# Patient Record
Sex: Male | Born: 1990 | Race: White | Hispanic: No | Marital: Single | Smoking: Former smoker
Health system: Southern US, Community
[De-identification: ages and names within clinical notes are randomized; demographics above are authoritative.]

## PROBLEM LIST (undated history)

## (undated) DIAGNOSIS — K219 Gastro-esophageal reflux disease without esophagitis: Secondary | ICD-10-CM

## (undated) DIAGNOSIS — F32A Depression, unspecified: Secondary | ICD-10-CM

## (undated) DIAGNOSIS — F909 Attention-deficit hyperactivity disorder, unspecified type: Secondary | ICD-10-CM

## (undated) DIAGNOSIS — F329 Major depressive disorder, single episode, unspecified: Secondary | ICD-10-CM

## (undated) DIAGNOSIS — F1090 Alcohol use, unspecified, uncomplicated: Secondary | ICD-10-CM

## (undated) DIAGNOSIS — F419 Anxiety disorder, unspecified: Secondary | ICD-10-CM

## (undated) DIAGNOSIS — F319 Bipolar disorder, unspecified: Secondary | ICD-10-CM

## (undated) HISTORY — PX: HAND SURGERY: SHX662

---

## 2012-12-03 ENCOUNTER — Inpatient Hospital Stay: Payer: Self-pay | Admitting: Psychiatry

## 2012-12-03 LAB — URINALYSIS, COMPLETE
Bilirubin,UR: NEGATIVE
Blood: NEGATIVE
Glucose,UR: NEGATIVE mg/dL (ref 0–75)
Ketone: NEGATIVE
Leukocyte Esterase: NEGATIVE
Squamous Epithelial: <1
WBC UR: 5 /HPF (ref 0–5)

## 2012-12-03 LAB — CBC
HCT: 44 % (ref 40.0–52.0)
HGB: 15.5 g/dL (ref 13.0–18.0)
MCHC: 35.3 g/dL (ref 32.0–36.0)
MCV: 88 fL (ref 80–100)
Platelet: 209 10*3/uL (ref 150–440)
RDW: 12.6 % (ref 11.5–14.5)
WBC: 7 10*3/uL (ref 3.8–10.6)

## 2012-12-03 LAB — COMPREHENSIVE METABOLIC PANEL
Anion Gap: 4 — ABNORMAL LOW (ref 7–16)
BUN: 11 mg/dL (ref 7–18)
Bilirubin,Total: 0.5 mg/dL (ref 0.2–1.0)
Calcium, Total: 9.1 mg/dL (ref 8.5–10.1)
Co2: 30 mmol/L (ref 21–32)
Creatinine: 1.04 mg/dL (ref 0.60–1.30)
EGFR (Non-African Amer.): >60
Glucose: 72 mg/dL (ref 65–99)
Osmolality: 275 (ref 275–301)
Potassium: 3.7 mmol/L (ref 3.5–5.1)
SGPT (ALT): 18 U/L (ref 12–78)
Sodium: 139 mmol/L (ref 136–145)
Total Protein: 7.4 g/dL (ref 6.4–8.2)

## 2012-12-03 LAB — DRUG SCREEN, URINE
Amphetamines, Ur Screen: NEGATIVE (ref ?–1000)
Barbiturates, Ur Screen: NEGATIVE (ref ?–200)
Benzodiazepine, Ur Scrn: NEGATIVE (ref ?–200)
Cocaine Metabolite,Ur ~~LOC~~: NEGATIVE (ref ?–300)
Methadone, Ur Screen: NEGATIVE (ref ?–300)
Phencyclidine (PCP) Ur S: NEGATIVE (ref ?–25)

## 2012-12-03 LAB — ETHANOL
Ethanol %: <0.003 % (ref 0.000–0.080)
Ethanol: <3 mg/dL

## 2013-08-12 ENCOUNTER — Emergency Department (HOSPITAL_COMMUNITY)
Admission: EM | Admit: 2013-08-12 | Discharge: 2013-08-12 | Disposition: A | Discharge status: Home / Self Care | Payer: BC Managed Care – PPO | Attending: Emergency Medicine | Admitting: Emergency Medicine

## 2013-08-12 ENCOUNTER — Emergency Department (HOSPITAL_COMMUNITY): Payer: BC Managed Care – PPO

## 2013-08-12 ENCOUNTER — Encounter (HOSPITAL_COMMUNITY): Payer: Self-pay | Admitting: Emergency Medicine

## 2013-08-12 DIAGNOSIS — S335XXA Sprain of ligaments of lumbar spine, initial encounter: Secondary | ICD-10-CM | POA: Insufficient documentation

## 2013-08-12 DIAGNOSIS — Z87891 Personal history of nicotine dependence: Secondary | ICD-10-CM | POA: Insufficient documentation

## 2013-08-12 DIAGNOSIS — S60219A Contusion of unspecified wrist, initial encounter: Secondary | ICD-10-CM | POA: Insufficient documentation

## 2013-08-12 DIAGNOSIS — S5000XA Contusion of unspecified elbow, initial encounter: Secondary | ICD-10-CM | POA: Insufficient documentation

## 2013-08-12 DIAGNOSIS — F909 Attention-deficit hyperactivity disorder, unspecified type: Secondary | ICD-10-CM | POA: Insufficient documentation

## 2013-08-12 DIAGNOSIS — S90122A Contusion of left lesser toe(s) without damage to nail, initial encounter: Secondary | ICD-10-CM

## 2013-08-12 DIAGNOSIS — S0990XA Unspecified injury of head, initial encounter: Secondary | ICD-10-CM | POA: Insufficient documentation

## 2013-08-12 DIAGNOSIS — S5002XA Contusion of left elbow, initial encounter: Secondary | ICD-10-CM

## 2013-08-12 DIAGNOSIS — S60211A Contusion of right wrist, initial encounter: Secondary | ICD-10-CM

## 2013-08-12 DIAGNOSIS — S9030XA Contusion of unspecified foot, initial encounter: Secondary | ICD-10-CM | POA: Insufficient documentation

## 2013-08-12 DIAGNOSIS — S90129A Contusion of unspecified lesser toe(s) without damage to nail, initial encounter: Secondary | ICD-10-CM | POA: Insufficient documentation

## 2013-08-12 DIAGNOSIS — S20212A Contusion of left front wall of thorax, initial encounter: Secondary | ICD-10-CM

## 2013-08-12 DIAGNOSIS — F319 Bipolar disorder, unspecified: Secondary | ICD-10-CM | POA: Insufficient documentation

## 2013-08-12 DIAGNOSIS — S39012A Strain of muscle, fascia and tendon of lower back, initial encounter: Secondary | ICD-10-CM

## 2013-08-12 DIAGNOSIS — Y92410 Unspecified street and highway as the place of occurrence of the external cause: Secondary | ICD-10-CM | POA: Insufficient documentation

## 2013-08-12 DIAGNOSIS — S20219A Contusion of unspecified front wall of thorax, initial encounter: Secondary | ICD-10-CM | POA: Insufficient documentation

## 2013-08-12 DIAGNOSIS — Z23 Encounter for immunization: Secondary | ICD-10-CM | POA: Insufficient documentation

## 2013-08-12 DIAGNOSIS — S9032XA Contusion of left foot, initial encounter: Secondary | ICD-10-CM

## 2013-08-12 DIAGNOSIS — Y9389 Activity, other specified: Secondary | ICD-10-CM | POA: Insufficient documentation

## 2013-08-12 HISTORY — DX: Depression, unspecified: F32.A

## 2013-08-12 HISTORY — DX: Attention-deficit hyperactivity disorder, unspecified type: F90.9

## 2013-08-12 HISTORY — DX: Major depressive disorder, single episode, unspecified: F32.9

## 2013-08-12 HISTORY — DX: Anxiety disorder, unspecified: F41.9

## 2013-08-12 HISTORY — DX: Bipolar disorder, unspecified: F31.9

## 2013-08-12 MED ORDER — NAPROXEN 500 MG PO TABS: 500.0000 mg | ORAL_TABLET | Freq: Two times a day (BID) | ORAL | Status: DC

## 2013-08-12 MED ORDER — TETANUS-DIPHTH-ACELL PERTUSSIS 5-2.5-18.5 LF-MCG/0.5 IM SUSP
0.5000 mL | Freq: Once | INTRAMUSCULAR | Status: AC
  Administered 2013-08-12: 0.5 mL via INTRAMUSCULAR
  Filled 2013-08-12: qty 0.5

## 2013-08-12 MED ORDER — OXYCODONE-ACETAMINOPHEN 5-325 MG PO TABS: 1.0000 | ORAL_TABLET | ORAL | Status: DC | PRN

## 2013-08-12 NOTE — ED Notes (Signed)
All abrasions cleaned w/ Shur Clens. All bleeding controlled.
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
## 2013-08-12 NOTE — ED Notes (Signed)
Patient and patients mother state understanding of discharge instructions and prescription medications. Pt verbalizes understanding of home wound care. Patient ambulatory out of the department at this time with mother.

## 2013-08-12 NOTE — ED Notes (Signed)
Pt placed in c-collar
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
## 2022-02-14 DIAGNOSIS — G629 Polyneuropathy, unspecified: Secondary | ICD-10-CM

## 2022-02-14 HISTORY — DX: Polyneuropathy, unspecified: G62.9

## 2022-05-25 ENCOUNTER — Ambulatory Visit: Payer: Self-pay | Admitting: Orthopedic Surgery

## 2022-05-25 DIAGNOSIS — M5126 Other intervertebral disc displacement, lumbar region: Secondary | ICD-10-CM

## 2022-05-31 NOTE — Pre-Procedure Instructions (Signed)
Surgical Instructions    Your procedure is scheduled on June 09, 2022.  Report to [facility] Main Entrance "A" at 5:30 A.M., then check in with the Admitting office.  Call this number if you have problems the morning of surgery:  [phone]  If you have any questions prior to your surgery date call [phone]: Open Monday-Friday 8am-4pm If you experience any cold or flu symptoms such as cough, fever, chills, shortness of breath, etc. between now and your scheduled surgery, please notify us at the above number.     Remember:  Do not eat after midnight the night before your surgery  You may drink clear liquids until 4:30 AM the morning of your surgery.   Clear liquids allowed are: Water, Non-Citrus Juices (without pulp), Carbonated Beverages, Clear Tea, Black Coffee Only (NO MILK, CREAM OR POWDERED CREAMER of any kind), and Gatorade.  Patient Instructions  The night before surgery:  No food after midnight. ONLY clear liquids after midnight  The day of surgery (if you do NOT have diabetes):  Drink ONE (1) Pre-Surgery Clear Ensure by 4:30 AM the morning of surgery. Drink in one sitting. Do not sip.  This drink was given to you during your hospital  pre-op appointment visit.  Nothing else to drink after completing the  Pre-Surgery Clear Ensure.         If you have questions, please contact your surgeon's office.     Take these medicines the morning of surgery with A SIP OF WATER:  gabapentin (NEURONTIN)    As of today, STOP taking any Aspirin (unless otherwise instructed by your surgeon) Aleve, Naproxen, Ibuprofen, Motrin, Advil, Goody's, BC's, all herbal medications, fish oil, and all vitamins.                     Do NOT Smoke (Tobacco/Vaping) for 24 hours prior to your procedure.  If you use a CPAP at night, you may bring your mask/headgear for your overnight stay.   Contacts, glasses, piercing's, hearing aid's, dentures or partials may not be worn into surgery, please  bring cases for these belongings.    For patients admitted to the hospital, discharge time will be determined by your treatment team.   Patients discharged the day of surgery will not be allowed to drive home, and someone needs to stay with them for 24 hours.  SURGICAL WAITING ROOM VISITATION Patients having a surgery/procedure may have no more than 2 support people in the waiting area - these visitors may rotate.   Children under the age of 50 must have an adult with them who is not the patient. Pre-op nurse will coordinate an appropriate time for 1 support person to accompany patient in pre-op.  This support person may not rotate.   Please refer to the [facility] website for the visitor guidelines for Inpatients (after your surgery is over and you are in a regular room).   Please follow the instructions on the handout you received at your pre-admission appointment about preparing for your upcoming surgery using the CHG surgical soap. If you have any questions or concerns, please call one of the numbers listed on the first page of this paperwork.   If you received a COVID test during your pre-op visit  it is requested that you wear a mask when out in public, stay away from anyone that may not be feeling well and notify your surgeon if you develop symptoms. If you have been in contact with anyone that  has tested positive in the last 10 days please notify you surgeon.

## 2022-06-01 ENCOUNTER — Encounter (HOSPITAL_COMMUNITY)
Admission: RE | Admit: 2022-06-01 | Discharge: 2022-06-01 | Disposition: A | Discharge status: Home / Self Care | Payer: 59 | Source: Ambulatory Visit | Attending: Specialist | Admitting: Specialist

## 2022-06-01 ENCOUNTER — Other Ambulatory Visit: Payer: Self-pay

## 2022-06-01 ENCOUNTER — Ambulatory Visit (HOSPITAL_COMMUNITY)
Admission: RE | Admit: 2022-06-01 | Discharge: 2022-06-01 | Disposition: A | Discharge status: Home / Self Care | Payer: Self-pay | Source: Ambulatory Visit | Attending: Orthopedic Surgery | Admitting: Orthopedic Surgery

## 2022-06-01 ENCOUNTER — Encounter (HOSPITAL_COMMUNITY): Payer: Self-pay

## 2022-06-01 VITALS — BP 143/71 | HR 88 | Temp 98.6°F | Resp 20 | Ht 76.0 in | Wt 183.3 lb

## 2022-06-01 DIAGNOSIS — Z01818 Encounter for other preprocedural examination: Secondary | ICD-10-CM | POA: Insufficient documentation

## 2022-06-01 DIAGNOSIS — Z789 Other specified health status: Secondary | ICD-10-CM | POA: Diagnosis not present

## 2022-06-01 DIAGNOSIS — M5126 Other intervertebral disc displacement, lumbar region: Secondary | ICD-10-CM

## 2022-06-01 HISTORY — DX: Gastro-esophageal reflux disease without esophagitis: K21.9

## 2022-06-01 HISTORY — DX: Alcohol use, unspecified, uncomplicated: F10.90

## 2022-06-01 LAB — CBC
HCT: 44.7 % (ref 39.0–52.0)
Hemoglobin: 15.7 g/dL (ref 13.0–17.0)
MCH: 31 pg (ref 26.0–34.0)
MCHC: 35.1 g/dL (ref 30.0–36.0)
MCV: 88.2 fL (ref 80.0–100.0)
Platelets: 225 10*3/uL (ref 150–400)
RBC: 5.07 MIL/uL (ref 4.22–5.81)
RDW: 12.3 % (ref 11.5–15.5)
WBC: 6.5 10*3/uL (ref 4.0–10.5)
nRBC: 0 % (ref 0.0–0.2)

## 2022-06-01 LAB — SURGICAL PCR SCREEN
MRSA, PCR: NEGATIVE
Staphylococcus aureus: POSITIVE — AB

## 2022-06-01 LAB — COMPREHENSIVE METABOLIC PANEL
ALT: 114 U/L — ABNORMAL HIGH (ref 0–44)
AST: 56 U/L — ABNORMAL HIGH (ref 15–41)
Albumin: 3.9 g/dL (ref 3.5–5.0)
Alkaline Phosphatase: 79 U/L (ref 38–126)
Anion gap: 11 (ref 5–15)
BUN: 13 mg/dL (ref 6–20)
CO2: 26 mmol/L (ref 22–32)
Calcium: 9.9 mg/dL (ref 8.9–10.3)
Chloride: 100 mmol/L (ref 98–111)
Creatinine, Ser: 1.08 mg/dL (ref 0.61–1.24)
GFR, Estimated: >60 mL/min (ref >60)
Glucose, Bld: 87 mg/dL (ref 70–99)
Potassium: 4.3 mmol/L (ref 3.5–5.1)
Sodium: 137 mmol/L (ref 135–145)
Total Bilirubin: 0.6 mg/dL (ref 0.3–1.2)
Total Protein: 7.1 g/dL (ref 6.5–8.1)

## 2022-06-01 NOTE — Progress Notes (Addendum)
PCP - Pt does not have one Cardiologist - Denies  PPM/ICD - Denies Device Orders - n/a Rep Notified - n/a   Chest x-ray - n/a EKG - Denies Stress Test - Denies ECHO - Denies Cardiac Cath - Denies  Sleep Study - Pt has had a sleep study before but result was negative. CPAP - n/a  No MD  Last dose of GLP1 agonist- n/a GLP1 instructions: n/a  Blood Thinner Instructions: n/a Aspirin Instructions: n/a  ERAS Protcol - Clear liquids until 0430 morning of surgery PRE-SURGERY Ensure or G2- Ensure given to pt with instructions  COVID TEST- n/a   Anesthesia review: Yes. Abnormal ALT result on BMP.   Patient denies shortness of breath, fever, cough and chest pain at PAT appointment. Pt had a cold with fever at the end of February with resolution of symptoms within one week. He has been asymptomatic for greater than two weeks. Discussed with Antionette Poles, PA-C   All instructions explained to the patient, with a verbal understanding of the material. Patient agrees to go over the instructions while at home for a better understanding. Patient also instructed to self quarantine after being tested for COVID-19. The opportunity to ask questions was provided.

## 2022-06-02 ENCOUNTER — Ambulatory Visit: Payer: Self-pay | Admitting: Orthopedic Surgery

## 2022-06-02 NOTE — H&P (View-Only) (Signed)
Alonza Knisley is an 32 y.o. male.   Chief Complaint: back and right leg pain HPI: Reason for Visit: (normal) visit for: (back) Context: 3 months - onset Location (Lower Extremity): lower back pain ; leg pain on the right, , Severity: pain level 5/10 Timing: constant Aggravating Factors: lying down; standing upright; sitting; Associated Symptoms: numbness/tingling Medications: The patient is taking gabapentin Notes: right L5 SNRB ordered 05/13/22 - still pending as of 05/17/22 Patient reports no improvement in numbness. He feels like his leg is getting weaker. Therapy has made him worse  Past Medical History:  Diagnosis Date   ADHD (attention deficit hyperactivity disorder)    Anxiety    Bipolar 1 disorder    Depression    GERD (gastroesophageal reflux disease)    Neuropathy 2024   Right Leg    Past Surgical History:  Procedure Laterality Date   HAND SURGERY Left    Clean out MRSA infection    No family history on file. Social History:  reports that he quit smoking about 4 years ago. His smoking use included cigarettes. His smokeless tobacco use includes chew. He reports that he does not currently use alcohol after a past usage of about 12.0 standard drinks of alcohol per week. He reports that he does not use drugs.  Allergies: No Known Allergies  Current meds: gabapentin   Results for orders placed or performed during the hospital encounter of 06/01/22 (from the past 48 hour(s))  Surgical pcr screen     Status: Abnormal   Collection Time: 06/01/22  9:14 AM   Specimen: Nasal Mucosa; Nasal Swab  Result Value Ref Range   MRSA, PCR NEGATIVE NEGATIVE   Staphylococcus aureus POSITIVE (A) NEGATIVE    Comment: (NOTE) The Xpert SA Assay (FDA approved for NASAL specimens in patients 63 years of age and older), is one component of a comprehensive surveillance program. It is not intended to diagnose infection nor to guide or monitor treatment. Performed at [facility]  [facility], [address] [place], [place] [address]   Comprehensive metabolic panel per protocol     Status: Abnormal   Collection Time: 06/01/22  9:30 AM  Result Value Ref Range   Sodium 137 135 - 145 mmol/L   Potassium 4.3 3.5 - 5.1 mmol/L   Chloride 100 98 - 111 mmol/L   CO2 26 22 - 32 mmol/L   Glucose, Bld 87 70 - 99 mg/dL    Comment: Glucose reference range applies only to samples taken after fasting for at least 8 hours.   BUN 13 6 - 20 mg/dL   Creatinine, Ser 2.95 0.61 - 1.24 mg/dL   Calcium 9.9 8.9 - 62.1 mg/dL   Total Protein 7.1 6.5 - 8.1 g/dL   Albumin 3.9 3.5 - 5.0 g/dL   AST 56 (H) 15 - 41 U/L   ALT 114 (H) 0 - 44 U/L   Alkaline Phosphatase 79 38 - 126 U/L   Total Bilirubin 0.6 0.3 - 1.2 mg/dL   GFR, Estimated >30 >86 mL/min    Comment: (NOTE) Calculated using the CKD-EPI Creatinine Equation (2021)    Anion gap 11 5 - 15    Comment: Performed at [facility], [address], [place], [place] [address]  CBC per protocol     Status: None   Collection Time: 06/01/22  9:30 AM  Result Value Ref Range   WBC 6.5 4.0 - 10.5 K/uL   RBC 5.07 4.22 - 5.81 MIL/uL  Hemoglobin 15.7 13.0 - 17.0 g/dL   HCT 16.1 09.6 - 04.5 %   MCV 88.2 80.0 - 100.0 fL   MCH 31.0 26.0 - 34.0 pg   MCHC 35.1 30.0 - 36.0 g/dL   RDW 40.9 81.1 - 91.4 %   Platelets 225 150 - 400 K/uL   nRBC 0.0 0.0 - 0.2 %    Comment: Performed at [facility], [address], [place], [place] [address]   No results found.  Review of Systems  Constitutional: Negative.   HENT: Negative.    Eyes: Negative.   Respiratory: Negative.    Cardiovascular: Negative.   Gastrointestinal: Negative.   Endocrine: Negative.   Genitourinary: Negative.   Musculoskeletal:  Positive for back pain and gait problem.  Skin: Negative.   Neurological:  Positive for weakness and numbness.  Hematological: Negative.   Psychiatric/Behavioral: Negative.      There were no vitals taken for this visit. Physical  Exam Constitutional:      Appearance: Normal appearance.  HENT:     Head: Normocephalic and atraumatic.     Right Ear: External ear normal.     Left Ear: External ear normal.     Nose: Nose normal.     Mouth/Throat:     Pharynx: Oropharynx is clear.  Eyes:     Conjunctiva/sclera: Conjunctivae normal.  Cardiovascular:     Rate and Rhythm: Normal rate and regular rhythm.     Pulses: Normal pulses.     Heart sounds: Normal heart sounds.  Pulmonary:     Effort: Pulmonary effort is normal.     Breath sounds: Normal breath sounds.  Abdominal:     General: Bowel sounds are normal.  Musculoskeletal:     Cervical back: Normal range of motion.     Comments: Gait and Station: Appearance: ambulating with no assistive devices and antalgic gait.  Constitutional: General Appearance: healthy-appearing.  Psychiatric: Mood and Affect: active and alert.  Cardiovascular System: Edema Right: none; Dorsalis and posterior tibial pulses 2+. Edema Left: none.  Abdomen: Inspection and Palpation: non-distended and no tenderness.  Skin: Inspection and palpation: no rash.  Lumbar Spine: Inspection: normal alignment. Bony Palpation of the Lumbar Spine: tender at lumbosacral junction.. Bony Palpation of the Right Hip: no tenderness of the greater trochanter and tenderness of the SI joint; Pelvis stable. Bony Palpation of the Left Hip: no tenderness of the greater trochanter and tenderness of the SI joint. Soft Tissue Palpation on the Right: No flank pain with percussion. Active Range of Motion: limited flexion and extention.  Motor Strength: L1 Motor Strength on the Right: hip flexion iliopsoas 5/5. L1 Motor Strength on the Left: hip flexion iliopsoas 5/5. L2-L4 Motor Strength on the Right: knee extension quadriceps 5/5. L2-L4 Motor Strength on the Left: knee extension quadriceps 5/5. L5 Motor Strength on the Right: ankle dorsiflexion tibialis anterior 5/5 and great toe extension extensor hallucis longus 2/5.  L5 Motor Strength on the Left: ankle dorsiflexion tibialis anterior 5/5 and great toe extension extensor hallucis longus 5/5. S1 Motor Strength on the Right: plantar flexion gastrocnemius 5/5. S1 Motor Strength on the Left: plantar flexion gastrocnemius 5/5.  Neurological System: Knee Reflex Right: normal (2). Knee Reflex Left: normal (2). Ankle Reflex Right: normal (2). Ankle Reflex Left: normal (2). Babinski Reflex Right: plantar reflex absent. Babinski Reflex Left: plantar reflex absent. Sensation on the Right: normal distal extremities and decreased sensation on the lateral leg and dorsum of the foot (L5). Sensation on the  Left: normal distal extremities. Special Tests on the Right: no clonus of the ankle/knee and seated straight leg raising test positive. Special Tests on the Left: no clonus of the ankle/knee.  Skin:    General: Skin is warm and dry.  Neurological:     Mental Status: He is alert.    MRI demonstrates foraminal disc herniation L5-S1 on the right impinging upon the L5 nerve root.  Assessment/Plan Impression:  1. Progressive foot drop on the right secondary to foraminal disc herniation L5-S1 on the right with compression of the L5 nerve root  Plan:  Given the progression of his neurologic deficit indicated that it would be appropriate to consider a microdiscectomy at L5-S1 on the right to prevent further deterioration in his neurologic status.  Patient did not get his selective nerve root block as that was not authorized. He has been undergoing therapy and he reports that it made him worse  I had an extensive discussion with the patient concerning the pathology relevant anatomy and treatment options. At this point exhausting conservative treatment and in the presence of a neurologic deficit we discussed microlumbar decompression. I discussed the risks and benefits including bleeding, infection, DVT, PE, anesthetic complications, worsening in their symptoms, improvement in their  symptoms, C SF leakage, epidural fibrosis, need for future surgeries such as revision discectomy and lumbar fusion. I also indicated that this is an operation to basically decompress the nerve roots to allow recovery as opposed to fixing a herniated disc if it is encountered and that the incidence of recurrent chest disc herniation can approach 15%. Also that nerve root recovery is variable and may not recover completely. Any ligament or bone that is contributing to compressing the nerves will be removed as well.  I discussed the operative course including overnight in the hospital. Immediate ambulation. Follow-up in 2 weeks for suture removal. 6 weeks until healing of the herniation and surgical incision followed by 6 weeks of reconditioning and strengthening of the core musculature. Also discussed the need to employ the concepts of disc pressure management and core motion following the surgery to minimize the risk of recurrent disc herniation. We will obtain preoperative clearance i if necessary and proceed accordingly.  No history of DVT or MRSA. Kefzol. Outpatient. Aspirin.  Plan microdiscectomy L5-S1 right  Dorothy Spark, PA-C for Dr Shelle Iron 06/02/2022, 1:21 PM

## 2022-06-02 NOTE — H&P (Signed)
Douglas Kirk is an 32 y.o. male.   Chief Complaint: back and right leg pain HPI: Reason for Visit: (normal) visit for: (back) Context: 3 months - onset Location (Lower Extremity): lower back pain ; leg pain on the right, , Severity: pain level 5/10 Timing: constant Aggravating Factors: lying down; standing upright; sitting; Associated Symptoms: numbness/tingling Medications: The patient is taking gabapentin Notes: right L5 SNRB ordered 05/13/22 - still pending as of 05/17/22 Patient reports no improvement in numbness. He feels like his leg is getting weaker. Therapy has made him worse  Past Medical History:  Diagnosis Date   ADHD (attention deficit hyperactivity disorder)    Anxiety    Bipolar 1 disorder    Depression    GERD (gastroesophageal reflux disease)    Neuropathy 2024   Right Leg    Past Surgical History:  Procedure Laterality Date   HAND SURGERY Left    Clean out MRSA infection    No family history on file. Social History:  reports that he quit smoking about 4 years ago. His smoking use included cigarettes. His smokeless tobacco use includes chew. He reports that he does not currently use alcohol after a past usage of about 12.0 standard drinks of alcohol per week. He reports that he does not use drugs.  Allergies: No Known Allergies  Current meds: gabapentin   Results for orders placed or performed during the hospital encounter of 06/01/22 (from the past 48 hour(s))  Surgical pcr screen     Status: Abnormal   Collection Time: 06/01/22  9:14 AM   Specimen: Nasal Mucosa; Nasal Swab  Result Value Ref Range   MRSA, PCR NEGATIVE NEGATIVE   Staphylococcus aureus POSITIVE (A) NEGATIVE    Comment: (NOTE) The Xpert SA Assay (FDA approved for NASAL specimens in patients 22 years of age and older), is one component of a comprehensive surveillance program. It is not intended to diagnose infection nor to guide or monitor treatment. Performed at [facility]  [facility], [address] [place], [place] [address]   Comprehensive metabolic panel per protocol     Status: Abnormal   Collection Time: 06/01/22  9:30 AM  Result Value Ref Range   Sodium 137 135 - 145 mmol/L   Potassium 4.3 3.5 - 5.1 mmol/L   Chloride 100 98 - 111 mmol/L   CO2 26 22 - 32 mmol/L   Glucose, Bld 87 70 - 99 mg/dL    Comment: Glucose reference range applies only to samples taken after fasting for at least 8 hours.   BUN 13 6 - 20 mg/dL   Creatinine, Ser 1.08 0.61 - 1.24 mg/dL   Calcium 9.9 8.9 - 10.3 mg/dL   Total Protein 7.1 6.5 - 8.1 g/dL   Albumin 3.9 3.5 - 5.0 g/dL   AST 56 (H) 15 - 41 U/L   ALT 114 (H) 0 - 44 U/L   Alkaline Phosphatase 79 38 - 126 U/L   Total Bilirubin 0.6 0.3 - 1.2 mg/dL   GFR, Estimated >60 >60 mL/min    Comment: (NOTE) Calculated using the CKD-EPI Creatinine Equation (2021)    Anion gap 11 5 - 15    Comment: Performed at [facility], [address] [place], [place] [address]  CBC per protocol     Status: None   Collection Time: 06/01/22  9:30 AM  Result Value Ref Range   WBC 6.5 4.0 - 10.5 K/uL   RBC 5.07 4.22 - 5.81 MIL/uL     Hemoglobin 15.7 13.0 - 17.0 g/dL   HCT 44.7 39.0 - 52.0 %   MCV 88.2 80.0 - 100.0 fL   MCH 31.0 26.0 - 34.0 pg   MCHC 35.1 30.0 - 36.0 g/dL   RDW 12.3 11.5 - 15.5 %   Platelets 225 150 - 400 K/uL   nRBC 0.0 0.0 - 0.2 %    Comment: Performed at [facility], [address] [place], [place] [address]   No results found.  Review of Systems  Constitutional: Negative.   HENT: Negative.    Eyes: Negative.   Respiratory: Negative.    Cardiovascular: Negative.   Gastrointestinal: Negative.   Endocrine: Negative.   Genitourinary: Negative.   Musculoskeletal:  Positive for back pain and gait problem.  Skin: Negative.   Neurological:  Positive for weakness and numbness.  Hematological: Negative.   Psychiatric/Behavioral: Negative.      There were no vitals taken for this visit. Physical  Exam Constitutional:      Appearance: Normal appearance.  HENT:     Head: Normocephalic and atraumatic.     Right Ear: External ear normal.     Left Ear: External ear normal.     Nose: Nose normal.     Mouth/Throat:     Pharynx: Oropharynx is clear.  Eyes:     Conjunctiva/sclera: Conjunctivae normal.  Cardiovascular:     Rate and Rhythm: Normal rate and regular rhythm.     Pulses: Normal pulses.     Heart sounds: Normal heart sounds.  Pulmonary:     Effort: Pulmonary effort is normal.     Breath sounds: Normal breath sounds.  Abdominal:     General: Bowel sounds are normal.  Musculoskeletal:     Cervical back: Normal range of motion.     Comments: Gait and Station: Appearance: ambulating with no assistive devices and antalgic gait.  Constitutional: General Appearance: healthy-appearing.  Psychiatric: Mood and Affect: active and alert.  Cardiovascular System: Edema Right: none; Dorsalis and posterior tibial pulses 2+. Edema Left: none.  Abdomen: Inspection and Palpation: non-distended and no tenderness.  Skin: Inspection and palpation: no rash.  Lumbar Spine: Inspection: normal alignment. Bony Palpation of the Lumbar Spine: tender at lumbosacral junction.. Bony Palpation of the Right Hip: no tenderness of the greater trochanter and tenderness of the SI joint; Pelvis stable. Bony Palpation of the Left Hip: no tenderness of the greater trochanter and tenderness of the SI joint. Soft Tissue Palpation on the Right: No flank pain with percussion. Active Range of Motion: limited flexion and extention.  Motor Strength: L1 Motor Strength on the Right: hip flexion iliopsoas 5/5. L1 Motor Strength on the Left: hip flexion iliopsoas 5/5. L2-L4 Motor Strength on the Right: knee extension quadriceps 5/5. L2-L4 Motor Strength on the Left: knee extension quadriceps 5/5. L5 Motor Strength on the Right: ankle dorsiflexion tibialis anterior 5/5 and great toe extension extensor hallucis longus 2/5.  L5 Motor Strength on the Left: ankle dorsiflexion tibialis anterior 5/5 and great toe extension extensor hallucis longus 5/5. S1 Motor Strength on the Right: plantar flexion gastrocnemius 5/5. S1 Motor Strength on the Left: plantar flexion gastrocnemius 5/5.  Neurological System: Knee Reflex Right: normal (2). Knee Reflex Left: normal (2). Ankle Reflex Right: normal (2). Ankle Reflex Left: normal (2). Babinski Reflex Right: plantar reflex absent. Babinski Reflex Left: plantar reflex absent. Sensation on the Right: normal distal extremities and decreased sensation on the lateral leg and dorsum of the foot (L5). Sensation on the   Left: normal distal extremities. Special Tests on the Right: no clonus of the ankle/knee and seated straight leg raising test positive. Special Tests on the Left: no clonus of the ankle/knee.  Skin:    General: Skin is warm and dry.  Neurological:     Mental Status: He is alert.    MRI demonstrates foraminal disc herniation L5-S1 on the right impinging upon the L5 nerve root.  Assessment/Plan Impression:  1. Progressive foot drop on the right secondary to foraminal disc herniation L5-S1 on the right with compression of the L5 nerve root  Plan:  Given the progression of his neurologic deficit indicated that it would be appropriate to consider a microdiscectomy at L5-S1 on the right to prevent further deterioration in his neurologic status.  Patient did not get his selective nerve root block as that was not authorized. He has been undergoing therapy and he reports that it made him worse  I had an extensive discussion with the patient concerning the pathology relevant anatomy and treatment options. At this point exhausting conservative treatment and in the presence of a neurologic deficit we discussed microlumbar decompression. I discussed the risks and benefits including bleeding, infection, DVT, PE, anesthetic complications, worsening in their symptoms, improvement in their  symptoms, C SF leakage, epidural fibrosis, need for future surgeries such as revision discectomy and lumbar fusion. I also indicated that this is an operation to basically decompress the nerve roots to allow recovery as opposed to fixing a herniated disc if it is encountered and that the incidence of recurrent chest disc herniation can approach 15%. Also that nerve root recovery is variable and may not recover completely. Any ligament or bone that is contributing to compressing the nerves will be removed as well.  I discussed the operative course including overnight in the hospital. Immediate ambulation. Follow-up in 2 weeks for suture removal. 6 weeks until healing of the herniation and surgical incision followed by 6 weeks of reconditioning and strengthening of the core musculature. Also discussed the need to employ the concepts of disc pressure management and core motion following the surgery to minimize the risk of recurrent disc herniation. We will obtain preoperative clearance i if necessary and proceed accordingly.  No history of DVT or MRSA. Kefzol. Outpatient. Aspirin.  Plan microdiscectomy L5-S1 right  Taevion Sikora M Curties Conigliaro, PA-C for Dr Beane 06/02/2022, 1:21 PM    

## 2022-06-02 NOTE — Progress Notes (Signed)
Anesthesia Chart Review:  Case: [id number] Date/Time: 06/09/22 0715   Procedure: Microdiscectomy L5-S1 Right (Right)   Anesthesia type: General   Pre-op diagnosis: Herniated disc L5-S1 right   Location: [facility] OR ROOM 18 / [facility] OR   Surgeons: Jene Every, MD       DISCUSSION: Patient is a 32 year old male scheduled for the above procedure. Per orthopedic surgery notes, he has progressive right foot drop secondary to foraminal disc herniation L5-S1 with compression of the L5 nerve root, and "MRI demonstrates foraminal disc herniation L5-S1 on the right impinging upon the L5 nerve root."   History includes former smoker (quit 2020; uses oral nicotine 5x/day), Bipolar 1 disorder, ADHD, anxiety, GERD, neuropathy (RLE), left hand MRSA (s/p surgery). Current alcohol use reported as 3 alcoholic beverages per day with more on the weekends. Denied illicit drug use.   Preoperative labs showed AST 56, ALT 114. There are no recent comparison labs, but they were normal on 12/03/12. He does not have a PCP. He has been on Neurontin, naproxen, and Percocet, although naproxen and Percocet are listed as no taking currently.   Lumbar xray report in process.  LFTs mildly elevated. No thrombocytopenia. He reported moderate alcohol intake and has been on NSAID and acetaminophen which could be contributing. I attempted to call him but was sent to voicemail. In the interim, discussed results with anesthesiologist Leslye Peer, MD. If no acute GI symptoms then no additional preoperative recommendations. Will attempt to reach him next week to confirm and advise follow-up in the future.   ADDENDUM 06/06/22 6:27 PM: I called and spoke with Douglas Kirk. He confirms he does not see a PCP. We discussed his elevated LFTs. He reported that the night before PAT he was up late and drank around 10 - 12 oz beers. His alcohol daily intake varies, but on average says he drinks 40-50 beers per week. He said he has had withdrawals in the past  after abstaining from alcohol for 3-4 days, but said his intake was higher then. He said he has not had withdrawals when he abstains for 1-2 days. He said he is taking very little pain medication (prescription or OTC). He denied jaundice, abdominal pain, N/V/D. We briefly discussed negative effects of long term alcohol on his liver, and I encouraged him to get established with a PCP to follow LFTs and alcohol use. He is aware not to quit alcohol cold [place] and advised he should discuss alcohol intake with Dr. Shelle Iron as he may require measures to prevent withdrawal should he require admissionl. He said out-patient surgery is anticipated. He knows not to use oral nicotine on the day of surgery.   Anesthesia team to evaluate on the day of surgery.   VS: BP (!) 143/71   Pulse 88   Temp 37 C (Oral)   Resp 20   Ht  (1.93 m)   Wt 83.1 kg   SpO2 99%   BMI 22.31 kg/m    PROVIDERS: Pcp, No   LABS: Preoperative labs noted. See DISCUSSION. (all labs ordered are listed, but only abnormal results are displayed)  Labs Reviewed  SURGICAL PCR SCREEN - Abnormal; Notable for the following components:      Result Value   Staphylococcus aureus POSITIVE (*)    All other components within normal limits  COMPREHENSIVE METABOLIC PANEL - Abnormal; Notable for the following components:   AST 56 (*)    ALT 114 (*)    All other components within normal limits  CBC     IMAGES: Xray L-spine 06/01/22:  FINDINGS: There is no evidence of lumbar spine fracture. Alignment is normal. Intervertebral disc spaces are maintained. IMPRESSION: Negative.   EKG: N/A   CV: N/A   Past Medical History:  Diagnosis Date   ADHD (attention deficit hyperactivity disorder)    Anxiety    Bipolar 1 disorder    Depression    GERD (gastroesophageal reflux disease)    Neuropathy 2024   Right Leg    Past Surgical History:  Procedure Laterality Date   HAND SURGERY Left    Clean out MRSA infection     MEDICATIONS:  gabapentin (NEURONTIN) 300 MG capsule   naproxen (NAPROSYN) 500 MG tablet   oxyCODONE-acetaminophen (PERCOCET/ROXICET) 5-325 MG per tablet   No current facility-administered medications for this encounter.    Shonna Chock, PA-C Surgical Short Stay/Anesthesiology [facility] Phone [phone] [facility] Phone [phone] 06/03/2022 5:25 PM

## 2022-06-03 ENCOUNTER — Encounter (HOSPITAL_COMMUNITY): Payer: Self-pay | Admitting: Specialist

## 2022-06-03 NOTE — Anesthesia Preprocedure Evaluation (Signed)
Anesthesia Evaluation  Patient identified by MRN, date of birth, ID band Patient awake    Reviewed: Allergy & Precautions, NPO status , Patient's Chart, lab work & pertinent test results  History of Anesthesia Complications Negative for: history of anesthetic complications  Airway Mallampati: II  TM Distance: >3 FB Neck ROM: Full    Dental no notable dental hx. (+) Dental Advisory Given   Pulmonary former smoker   Pulmonary exam normal        Cardiovascular negative cardio ROS Normal cardiovascular exam     Neuro/Psych  PSYCHIATRIC DISORDERS Anxiety Depression Bipolar Disorder   negative neurological ROS     GI/Hepatic ,GERD  ,,(+)     substance abuse  alcohol use  Endo/Other  negative endocrine ROS    Renal/GU negative Renal ROS     Musculoskeletal negative musculoskeletal ROS (+)    Abdominal   Peds  Hematology negative hematology ROS (+)   Anesthesia Other Findings   Reproductive/Obstetrics                             Anesthesia Physical Anesthesia Plan  ASA: 2  Anesthesia Plan: General   Post-op Pain Management:    Induction: Intravenous  PONV Risk Score and Plan: 3 and Ondansetron, Dexamethasone and Midazolam  Airway Management Planned: Oral ETT  Additional Equipment:   Intra-op Plan:   Post-operative Plan: Extubation in OR  Informed Consent: I have reviewed the patients History and Physical, chart, labs and discussed the procedure including the risks, benefits and alternatives for the proposed anesthesia with the patient or authorized representative who has indicated his/her understanding and acceptance.     Dental advisory given  Plan Discussed with: Anesthesiologist and CRNA  Anesthesia Plan Comments: (See PAT note written by Shonna Chock, PA-C. Reported regular alcohol use (beer),  approximately 40-50/week.   )       Anesthesia Quick  Evaluation

## 2022-06-06 ENCOUNTER — Encounter (HOSPITAL_COMMUNITY): Payer: Self-pay

## 2022-06-09 ENCOUNTER — Ambulatory Visit (HOSPITAL_COMMUNITY): Payer: 59

## 2022-06-09 ENCOUNTER — Other Ambulatory Visit: Payer: Self-pay

## 2022-06-09 ENCOUNTER — Ambulatory Visit (HOSPITAL_COMMUNITY): Payer: 59 | Admitting: Vascular Surgery

## 2022-06-09 ENCOUNTER — Ambulatory Visit (HOSPITAL_COMMUNITY)
Admission: RE | Admit: 2022-06-09 | Discharge: 2022-06-09 | Disposition: A | Discharge status: Home / Self Care | Payer: 59 | Source: Ambulatory Visit | Attending: Specialist | Admitting: Specialist

## 2022-06-09 ENCOUNTER — Encounter (HOSPITAL_COMMUNITY): Payer: Self-pay | Admitting: Specialist

## 2022-06-09 ENCOUNTER — Ambulatory Visit (HOSPITAL_BASED_OUTPATIENT_CLINIC_OR_DEPARTMENT_OTHER): Payer: 59 | Admitting: Certified Registered Nurse Anesthetist

## 2022-06-09 ENCOUNTER — Encounter (HOSPITAL_COMMUNITY)
Admission: RE | Disposition: A | Discharge status: Home / Self Care | Payer: Self-pay | Source: Ambulatory Visit | Attending: Specialist

## 2022-06-09 DIAGNOSIS — M4807 Spinal stenosis, lumbosacral region: Secondary | ICD-10-CM

## 2022-06-09 DIAGNOSIS — Z87891 Personal history of nicotine dependence: Secondary | ICD-10-CM | POA: Diagnosis not present

## 2022-06-09 DIAGNOSIS — Z789 Other specified health status: Secondary | ICD-10-CM

## 2022-06-09 DIAGNOSIS — M5117 Intervertebral disc disorders with radiculopathy, lumbosacral region: Secondary | ICD-10-CM | POA: Insufficient documentation

## 2022-06-09 DIAGNOSIS — M5126 Other intervertebral disc displacement, lumbar region: Secondary | ICD-10-CM

## 2022-06-09 DIAGNOSIS — M5127 Other intervertebral disc displacement, lumbosacral region: Secondary | ICD-10-CM | POA: Insufficient documentation

## 2022-06-09 DIAGNOSIS — F418 Other specified anxiety disorders: Secondary | ICD-10-CM

## 2022-06-09 HISTORY — PX: LUMBAR LAMINECTOMY/DECOMPRESSION MICRODISCECTOMY: SHX5026

## 2022-06-09 LAB — HEPATIC FUNCTION PANEL
ALT: 95 U/L — ABNORMAL HIGH (ref 0–44)
AST: 59 U/L — ABNORMAL HIGH (ref 15–41)
Albumin: 3.7 g/dL (ref 3.5–5.0)
Alkaline Phosphatase: 71 U/L (ref 38–126)
Bilirubin, Direct: <0.1 mg/dL (ref 0.0–0.2)
Total Bilirubin: 0.3 mg/dL (ref 0.3–1.2)
Total Protein: 7 g/dL (ref 6.5–8.1)

## 2022-06-09 SURGERY — LUMBAR LAMINECTOMY/DECOMPRESSION MICRODISCECTOMY 1 LEVEL
Anesthesia: General | Site: Back | Laterality: Right

## 2022-06-09 MED ORDER — HYDROMORPHONE HCL 1 MG/ML IJ SOLN
INTRAMUSCULAR | Status: AC
  Filled 2022-06-09: qty 1

## 2022-06-09 MED ORDER — PROPOFOL 10 MG/ML IV BOLUS
INTRAVENOUS | Status: AC
  Filled 2022-06-09: qty 20

## 2022-06-09 MED ORDER — ACETAMINOPHEN 325 MG PO TABS: 650.0000 mg | ORAL_TABLET | ORAL | Status: DC | PRN

## 2022-06-09 MED ORDER — ORAL CARE MOUTH RINSE: 15.0000 mL | Freq: Once | OROMUCOSAL | Status: AC

## 2022-06-09 MED ORDER — ONDANSETRON HCL 4 MG/2ML IJ SOLN: 4.0000 mg | Freq: Four times a day (QID) | INTRAMUSCULAR | Status: DC | PRN

## 2022-06-09 MED ORDER — HYDROMORPHONE HCL 1 MG/ML IJ SOLN
0.2500 mg | INTRAMUSCULAR | Status: DC | PRN
  Administered 2022-06-09 (×2): 0.5 mg via INTRAVENOUS

## 2022-06-09 MED ORDER — MENTHOL 3 MG MT LOZG: 1.0000 | LOZENGE | OROMUCOSAL | Status: DC | PRN

## 2022-06-09 MED ORDER — DEXAMETHASONE SODIUM PHOSPHATE 10 MG/ML IJ SOLN
INTRAMUSCULAR | Status: DC | PRN
  Administered 2022-06-09: 10 mg via INTRAVENOUS

## 2022-06-09 MED ORDER — DEXAMETHASONE SODIUM PHOSPHATE 10 MG/ML IJ SOLN
INTRAMUSCULAR | Status: AC
  Filled 2022-06-09: qty 1

## 2022-06-09 MED ORDER — CHLORHEXIDINE GLUCONATE 0.12 % MT SOLN
15.0000 mL | Freq: Once | OROMUCOSAL | Status: AC
  Administered 2022-06-09: 15 mL via OROMUCOSAL
  Filled 2022-06-09: qty 15

## 2022-06-09 MED ORDER — FENTANYL CITRATE (PF) 250 MCG/5ML IJ SOLN
INTRAMUSCULAR | Status: AC
  Filled 2022-06-09: qty 5

## 2022-06-09 MED ORDER — RISAQUAD PO CAPS
1.0000 | ORAL_CAPSULE | Freq: Every day | ORAL | Status: DC
  Administered 2022-06-09: 1 via ORAL
  Filled 2022-06-09: qty 1

## 2022-06-09 MED ORDER — KCL IN DEXTROSE-NACL 20-5-0.45 MEQ/L-%-% IV SOLN
INTRAVENOUS | Status: DC
  Filled 2022-06-09: qty 1000

## 2022-06-09 MED ORDER — ONDANSETRON HCL 4 MG/2ML IJ SOLN
INTRAMUSCULAR | Status: AC
  Filled 2022-06-09: qty 2

## 2022-06-09 MED ORDER — ALUM & MAG HYDROXIDE-SIMETH 200-200-20 MG/5ML PO SUSP: 30.0000 mL | Freq: Four times a day (QID) | ORAL | Status: DC | PRN

## 2022-06-09 MED ORDER — LIDOCAINE 2% (20 MG/ML) 5 ML SYRINGE
INTRAMUSCULAR | Status: AC
  Filled 2022-06-09: qty 5

## 2022-06-09 MED ORDER — THROMBIN 20000 UNITS EX SOLR: CUTANEOUS | Status: DC | PRN

## 2022-06-09 MED ORDER — MIDAZOLAM HCL 5 MG/5ML IJ SOLN
INTRAMUSCULAR | Status: DC | PRN
  Administered 2022-06-09: 2 mg via INTRAVENOUS

## 2022-06-09 MED ORDER — ACETAMINOPHEN 650 MG RE SUPP: 650.0000 mg | RECTAL | Status: DC | PRN

## 2022-06-09 MED ORDER — CEFAZOLIN SODIUM-DEXTROSE 2-4 GM/100ML-% IV SOLN
2.0000 g | INTRAVENOUS | Status: AC
  Administered 2022-06-09: 2 g via INTRAVENOUS
  Filled 2022-06-09: qty 100

## 2022-06-09 MED ORDER — ASPIRIN 81 MG PO TBEC: 81.0000 mg | DELAYED_RELEASE_TABLET | Freq: Two times a day (BID) | ORAL | 1 refills | Status: AC

## 2022-06-09 MED ORDER — KETAMINE HCL 10 MG/ML IJ SOLN
INTRAMUSCULAR | Status: DC | PRN
  Administered 2022-06-09: 30 mg via INTRAVENOUS
  Administered 2022-06-09 (×2): 10 mg via INTRAVENOUS

## 2022-06-09 MED ORDER — BUPIVACAINE-EPINEPHRINE 0.5% -1:200000 IJ SOLN
INTRAMUSCULAR | Status: DC | PRN
  Administered 2022-06-09: 3 mL

## 2022-06-09 MED ORDER — GABAPENTIN 300 MG PO CAPS: 300.0000 mg | ORAL_CAPSULE | ORAL | Status: DC

## 2022-06-09 MED ORDER — GABAPENTIN 300 MG PO CAPS
300.0000 mg | ORAL_CAPSULE | Freq: Every day | ORAL | Status: DC
  Administered 2022-06-09: 300 mg via ORAL
  Filled 2022-06-09: qty 1

## 2022-06-09 MED ORDER — PROPOFOL 10 MG/ML IV BOLUS
INTRAVENOUS | Status: DC | PRN
  Administered 2022-06-09: 190 mg via INTRAVENOUS

## 2022-06-09 MED ORDER — THROMBIN 20000 UNITS EX SOLR
CUTANEOUS | Status: AC
  Filled 2022-06-09: qty 20000

## 2022-06-09 MED ORDER — DOCUSATE SODIUM 100 MG PO CAPS: 100.0000 mg | ORAL_CAPSULE | Freq: Two times a day (BID) | ORAL | 1 refills | Status: AC | PRN

## 2022-06-09 MED ORDER — LACTATED RINGERS IV SOLN: INTRAVENOUS | Status: DC | PRN

## 2022-06-09 MED ORDER — LACTATED RINGERS IV SOLN: INTRAVENOUS | Status: DC

## 2022-06-09 MED ORDER — SUGAMMADEX SODIUM 200 MG/2ML IV SOLN
INTRAVENOUS | Status: DC | PRN
  Administered 2022-06-09: 200 mg via INTRAVENOUS

## 2022-06-09 MED ORDER — OXYCODONE HCL 5 MG PO TABS
10.0000 mg | ORAL_TABLET | ORAL | Status: DC | PRN
  Administered 2022-06-09: 10 mg via ORAL
  Filled 2022-06-09: qty 2

## 2022-06-09 MED ORDER — CEFAZOLIN SODIUM-DEXTROSE 2-4 GM/100ML-% IV SOLN
2.0000 g | Freq: Three times a day (TID) | INTRAVENOUS | Status: DC
  Administered 2022-06-09: 2 g via INTRAVENOUS
  Filled 2022-06-09: qty 100

## 2022-06-09 MED ORDER — PHENOL 1.4 % MT LIQD: 1.0000 | OROMUCOSAL | Status: DC | PRN

## 2022-06-09 MED ORDER — MIDAZOLAM HCL 2 MG/2ML IJ SOLN
INTRAMUSCULAR | Status: AC
  Filled 2022-06-09: qty 2

## 2022-06-09 MED ORDER — FENTANYL CITRATE (PF) 250 MCG/5ML IJ SOLN
INTRAMUSCULAR | Status: DC | PRN
  Administered 2022-06-09: 50 ug via INTRAVENOUS
  Administered 2022-06-09: 100 ug via INTRAVENOUS
  Administered 2022-06-09: 50 ug via INTRAVENOUS

## 2022-06-09 MED ORDER — ASPIRIN 81 MG PO TBEC: 81.0000 mg | DELAYED_RELEASE_TABLET | Freq: Two times a day (BID) | ORAL | 1 refills | Status: DC

## 2022-06-09 MED ORDER — ONDANSETRON HCL 4 MG/2ML IJ SOLN
INTRAMUSCULAR | Status: DC | PRN
  Administered 2022-06-09: 4 mg via INTRAVENOUS

## 2022-06-09 MED ORDER — GABAPENTIN 300 MG PO CAPS: 600.0000 mg | ORAL_CAPSULE | Freq: Every day | ORAL | Status: DC

## 2022-06-09 MED ORDER — POLYETHYLENE GLYCOL 3350 17 G PO PACK: 17.0000 g | PACK | Freq: Every day | ORAL | 0 refills | Status: AC

## 2022-06-09 MED ORDER — METHOCARBAMOL 500 MG PO TABS: 500.0000 mg | ORAL_TABLET | Freq: Three times a day (TID) | ORAL | 1 refills | Status: AC | PRN

## 2022-06-09 MED ORDER — ONDANSETRON HCL 4 MG PO TABS: 4.0000 mg | ORAL_TABLET | Freq: Four times a day (QID) | ORAL | Status: DC | PRN

## 2022-06-09 MED ORDER — KETAMINE HCL 50 MG/5ML IJ SOSY
PREFILLED_SYRINGE | INTRAMUSCULAR | Status: AC
  Filled 2022-06-09: qty 5

## 2022-06-09 MED ORDER — DOCUSATE SODIUM 100 MG PO CAPS
100.0000 mg | ORAL_CAPSULE | Freq: Two times a day (BID) | ORAL | Status: DC
  Administered 2022-06-09: 100 mg via ORAL
  Filled 2022-06-09: qty 1

## 2022-06-09 MED ORDER — MAGNESIUM CITRATE PO SOLN: 1.0000 | Freq: Once | ORAL | Status: DC | PRN

## 2022-06-09 MED ORDER — 0.9 % SODIUM CHLORIDE (POUR BTL) OPTIME
TOPICAL | Status: DC | PRN
  Administered 2022-06-09: 1000 mL

## 2022-06-09 MED ORDER — OXYCODONE HCL 5 MG PO TABS: 5.0000 mg | ORAL_TABLET | ORAL | Status: DC | PRN

## 2022-06-09 MED ORDER — ROCURONIUM BROMIDE 10 MG/ML (PF) SYRINGE
PREFILLED_SYRINGE | INTRAVENOUS | Status: AC
  Filled 2022-06-09: qty 10

## 2022-06-09 MED ORDER — METHOCARBAMOL 1000 MG/10ML IJ SOLN
500.0000 mg | Freq: Four times a day (QID) | INTRAVENOUS | Status: DC | PRN
  Filled 2022-06-09: qty 5

## 2022-06-09 MED ORDER — ACETAMINOPHEN 10 MG/ML IV SOLN
1000.0000 mg | INTRAVENOUS | Status: AC
  Administered 2022-06-09: 1000 mg via INTRAVENOUS
  Filled 2022-06-09: qty 100

## 2022-06-09 MED ORDER — METHOCARBAMOL 500 MG PO TABS
500.0000 mg | ORAL_TABLET | Freq: Four times a day (QID) | ORAL | Status: DC | PRN
  Administered 2022-06-09: 500 mg via ORAL
  Filled 2022-06-09: qty 1

## 2022-06-09 MED ORDER — BUPIVACAINE-EPINEPHRINE (PF) 0.5% -1:200000 IJ SOLN
INTRAMUSCULAR | Status: AC
  Filled 2022-06-09: qty 30

## 2022-06-09 MED ORDER — ROCURONIUM BROMIDE 10 MG/ML (PF) SYRINGE
PREFILLED_SYRINGE | INTRAVENOUS | Status: DC | PRN
  Administered 2022-06-09: 80 mg via INTRAVENOUS

## 2022-06-09 MED ORDER — POLYETHYLENE GLYCOL 3350 17 G PO PACK: 17.0000 g | PACK | Freq: Every day | ORAL | Status: DC | PRN

## 2022-06-09 MED ORDER — BISACODYL 5 MG PO TBEC: 5.0000 mg | DELAYED_RELEASE_TABLET | Freq: Every day | ORAL | Status: DC | PRN

## 2022-06-09 MED ORDER — OXYCODONE HCL 5 MG PO TABS: 5.0000 mg | ORAL_TABLET | ORAL | 0 refills | Status: AC | PRN

## 2022-06-09 MED ORDER — LIDOCAINE 2% (20 MG/ML) 5 ML SYRINGE
INTRAMUSCULAR | Status: DC | PRN
  Administered 2022-06-09: 100 mg via INTRAVENOUS

## 2022-06-09 SURGICAL SUPPLY — 61 items
APL SKNCLS STERI-STRIP NONHPOA (GAUZE/BANDAGES/DRESSINGS) ×1
BAG COUNTER SPONGE SURGICOUNT (BAG) ×1 IMPLANT
BAG DECANTER FOR FLEXI CONT (MISCELLANEOUS) IMPLANT
BAG SPNG CNTER NS LX DISP (BAG) ×1
BAND INSRT 18 STRL LF DISP RB (MISCELLANEOUS) ×2
BAND RUBBER #18 3X1/16 STRL (MISCELLANEOUS) ×2 IMPLANT
BENZOIN TINCTURE PRP APPL 2/3 (GAUZE/BANDAGES/DRESSINGS) IMPLANT
BUR EGG ELITE 5.0 (BURR) IMPLANT
BUR RND DIAMOND ELITE 4.0 (BURR) IMPLANT
CLEANER TIP ELECTROSURG 2X2 (MISCELLANEOUS) ×1 IMPLANT
CNTNR URN SCR LID CUP LEK RST (MISCELLANEOUS) ×1 IMPLANT
CONT SPEC 4OZ STRL OR WHT (MISCELLANEOUS) ×1
DRAPE LAPAROTOMY 100X72X124 (DRAPES) ×1 IMPLANT
DRAPE MICROSCOPE SLANT 54X150 (MISCELLANEOUS) ×1 IMPLANT
DRAPE SHEET LG 3/4 BI-LAMINATE (DRAPES) ×1 IMPLANT
DRAPE SURG 17X11 SM STRL (DRAPES) ×1 IMPLANT
DRAPE UTILITY XL STRL (DRAPES) ×1 IMPLANT
DRSG AQUACEL AG ADV 3.5X 4 (GAUZE/BANDAGES/DRESSINGS) IMPLANT
DRSG AQUACEL AG ADV 3.5X 6 (GAUZE/BANDAGES/DRESSINGS) IMPLANT
DRSG TELFA 3X8 NADH STRL (GAUZE/BANDAGES/DRESSINGS) IMPLANT
DURAPREP 26ML APPLICATOR (WOUND CARE) ×1 IMPLANT
DURASEAL SPINE SEALANT 3ML (MISCELLANEOUS) IMPLANT
ELECT BLADE 4.0 EZ CLEAN MEGAD (MISCELLANEOUS)
ELECT REM PT RETURN 9FT ADLT (ELECTROSURGICAL) ×1
ELECTRODE BLDE 4.0 EZ CLN MEGD (MISCELLANEOUS) IMPLANT
ELECTRODE REM PT RTRN 9FT ADLT (ELECTROSURGICAL) ×1 IMPLANT
GLOVE BIOGEL PI IND STRL 7.5 (GLOVE) ×1 IMPLANT
GLOVE SURG SS PI 7.0 STRL IVOR (GLOVE) ×1 IMPLANT
GLOVE SURG SS PI 8.0 STRL IVOR (GLOVE) ×2 IMPLANT
GOWN STRL REUS W/ TWL LRG LVL3 (GOWN DISPOSABLE) ×1 IMPLANT
GOWN STRL REUS W/ TWL XL LVL3 (GOWN DISPOSABLE) ×1 IMPLANT
GOWN STRL REUS W/TWL LRG LVL3 (GOWN DISPOSABLE) ×1
GOWN STRL REUS W/TWL XL LVL3 (GOWN DISPOSABLE) ×1
IV CATH 14GX2 1/4 (CATHETERS) ×1 IMPLANT
KIT BASIN OR (CUSTOM PROCEDURE TRAY) ×1 IMPLANT
NDL 22X1.5 STRL (OR ONLY) (MISCELLANEOUS) ×1 IMPLANT
NDL SPNL 18GX3.5 QUINCKE PK (NEEDLE) ×2 IMPLANT
NEEDLE 22X1.5 STRL (OR ONLY) (MISCELLANEOUS) ×1 IMPLANT
NEEDLE SPNL 18GX3.5 QUINCKE PK (NEEDLE) ×2 IMPLANT
PACK LAMINECTOMY NEURO (CUSTOM PROCEDURE TRAY) ×1 IMPLANT
PATTIES SURGICAL .25X.25 (GAUZE/BANDAGES/DRESSINGS) IMPLANT
PATTIES SURGICAL .75X.75 (GAUZE/BANDAGES/DRESSINGS) ×1 IMPLANT
SOLUTION PRONTOSAN WOUND 350ML (IRRIGATION / IRRIGATOR) IMPLANT
SPONGE SURGIFOAM ABS GEL 100 (HEMOSTASIS) ×1 IMPLANT
SPONGE T-LAP 4X18 ~~LOC~~+RFID (SPONGE) IMPLANT
STAPLER VISISTAT (STAPLE) IMPLANT
STRIP CLOSURE SKIN 1/2X4 (GAUZE/BANDAGES/DRESSINGS) ×1 IMPLANT
SUT NURALON 4 0 TR CR/8 (SUTURE) IMPLANT
SUT PROLENE 3 0 PS 2 (SUTURE) IMPLANT
SUT VIC AB 1 CT1 27 (SUTURE)
SUT VIC AB 1 CT1 27XBRD ANTBC (SUTURE) IMPLANT
SUT VIC AB 1-0 CT2 27 (SUTURE) IMPLANT
SUT VIC AB 2-0 CT1 27 (SUTURE)
SUT VIC AB 2-0 CT1 TAPERPNT 27 (SUTURE) IMPLANT
SUT VIC AB 2-0 CT2 27 (SUTURE) IMPLANT
SYR 3ML LL SCALE MARK (SYRINGE) ×1 IMPLANT
TOWEL GREEN STERILE (TOWEL DISPOSABLE) ×1 IMPLANT
TOWEL GREEN STERILE FF (TOWEL DISPOSABLE) ×1 IMPLANT
TRAY FOLEY MTR SLVR 16FR STAT (SET/KITS/TRAYS/PACK) ×1 IMPLANT
WIPE CHG 2% 2PK PREOPERATIVE (MISCELLANEOUS) ×1 IMPLANT
YANKAUER SUCT BULB TIP NO VENT (SUCTIONS) ×1 IMPLANT

## 2022-06-09 NOTE — Brief Op Note (Signed)
06/09/2022  7:18 AM  PATIENT:  Carmina Miller  32 y.o. male  PRE-OPERATIVE DIAGNOSIS:  Herniated disc L5-S1 right  POST-OPERATIVE DIAGNOSIS:  * No post-op diagnosis entered *  PROCEDURE:  Procedure(s): Microdiscectomy L5-S1 Right (Right)  SURGEON:  Surgeon(s) and Role:    Jene Every, MD - Primary  PHYSICIAN ASSISTANT:   ASSISTANTS: Bissell   ANESTHESIA:   general  EBL:  25cc   BLOOD ADMINISTERED:none  DRAINS: none   LOCAL MEDICATIONS USED:  MARCAINE     SPECIMEN:  No Specimen  DISPOSITION OF SPECIMEN:  N/A  COUNTS:  YES  TOURNIQUET:  * No tourniquets in log *  DICTATION: .Other Dictation: Dictation Number [id number]  PLAN OF CARE: Admit for overnight observation  PATIENT DISPOSITION:  PACU - hemodynamically stable.   Delay start of Pharmacological VTE agent (>24hrs) due to surgical blood loss or risk of bleeding: yes

## 2022-06-09 NOTE — Progress Notes (Signed)
Patient alert and oriented, voiding adequately, MAE well with no difficulty. Incision area cdi with no s/s of infection. Patient discharged home per order. Patient and girlfriend stated understanding of discharge instructions given. Patient has an appointment with Dr. Shelle Iron in 2 weeks.

## 2022-06-09 NOTE — Anesthesia Procedure Notes (Signed)
Procedure Name: Intubation Date/Time: 06/09/2022 7:42 AM  Performed by: Waynard Edwards, CRNAPre-anesthesia Checklist: Patient identified, Emergency Drugs available, Suction available and Patient being monitored Patient Re-evaluated:Patient Re-evaluated prior to induction Oxygen Delivery Method: Circle system utilized Preoxygenation: Pre-oxygenation with 100% oxygen Induction Type: IV induction Ventilation: Mask ventilation without difficulty Laryngoscope Size: Miller and 3 Grade View: Grade I Tube type: Oral Tube size: 7.5 mm Number of attempts: 1 Airway Equipment and Method: Stylet Placement Confirmation: ETT inserted through vocal cords under direct vision, positive ETCO2 and breath sounds checked- equal and bilateral Secured at: 24 cm Tube secured with: Tape Dental Injury: Teeth and Oropharynx as per pre-operative assessment

## 2022-06-09 NOTE — Plan of Care (Signed)
  Problem: Education: Goal: Ability to verbalize activity precautions or restrictions will improve Outcome: Completed/Met Goal: Knowledge of the prescribed therapeutic regimen will improve Outcome: Completed/Met Goal: Understanding of discharge needs will improve Outcome: Completed/Met   Problem: Activity: Goal: Ability to avoid complications of mobility impairment will improve Outcome: Completed/Met Goal: Ability to tolerate increased activity will improve Outcome: Completed/Met Goal: Will remain free from falls Outcome: Completed/Met   Problem: Bowel/Gastric: Goal: Gastrointestinal status for postoperative course will improve Outcome: Completed/Met   Problem: Clinical Measurements: Goal: Ability to maintain clinical measurements within normal limits will improve Outcome: Completed/Met Goal: Postoperative complications will be avoided or minimized Outcome: Completed/Met Goal: Diagnostic test results will improve Outcome: Completed/Met   Problem: Pain Management: Goal: Pain level will decrease Outcome: Completed/Met   Problem: Skin Integrity: Goal: Will show signs of wound healing Outcome: Completed/Met   Problem: Health Behavior/Discharge Planning: Goal: Identification of resources available to assist in meeting health care needs will improve Outcome: Completed/Met   Problem: Bladder/Genitourinary: Goal: Urinary functional status for postoperative course will improve Outcome: Completed/Met   Problem: Education: Goal: Knowledge of General Education information will improve Description: Including pain rating scale, medication(s)/side effects and non-pharmacologic comfort measures Outcome: Completed/Met   

## 2022-06-09 NOTE — Evaluation (Signed)
Occupational Therapy Evaluation and Discharge Patient Details Name: Douglas Kirk MRN: [id number] DOB: 1990-09-02 Today's Date: 06/09/2022   History of Present Illness Pt is a 32 year old man admitted for L5-S1 microdiscectomy, right and foraminotomies L5, right. 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   Clinical Impression   Pt is functioning modified independently in ADLs and mobility. All education completed, reinforced with written handout with pt and wife verbalizing understanding. No further OT needs.      Recommendations for follow up therapy are one component of a multi-disciplinary discharge planning process, led by the attending physician.  Recommendations may be updated based on patient status, additional functional criteria and insurance authorization.   Assistance Recommended at Discharge PRN  Patient can return home with the following Assistance with cooking/housework;Assist for transportation;Help with stairs or ramp for entrance    Functional Status Assessment  Patient has had a recent decline in their functional status and demonstrates the ability to make significant improvements in function in a reasonable and predictable amount of time.  Equipment Recommendations  None recommended by OT    Recommendations for Other Services       Precautions / Restrictions Precautions Precautions: Back Precaution Booklet Issued: Yes (comment) Restrictions Weight Bearing Restrictions: No      Mobility Bed Mobility Overal bed mobility: Modified Independent             General bed mobility comments: educated in log roll technique, increased time    Transfers Overall transfer level: Independent Equipment used: None                      Balance                                           ADL either performed or assessed with clinical judgement   ADL Overall ADL's : Modified independent                                        General ADL Comments: Educated pt in back precautions related to ADLs and IADLs including 2 cup method for oral care, figure 4 for LB dressing, use of long handled bath sponge for washing back and feet. Educated in IADLs to avoid.     Vision Baseline Vision/History: 1 Wears glasses Patient Visual Report: No change from baseline       Perception     Praxis      Pertinent Vitals/Pain Pain Assessment Pain Assessment: 0-10 Pain Score: 5  Pain Location: back Pain Descriptors / Indicators: Sore, Discomfort Pain Intervention(s): Monitored during session, Repositioned, Premedicated before session     Hand Dominance Right   Extremity/Trunk Assessment Upper Extremity Assessment Upper Extremity Assessment: Overall WFL for tasks assessed   Lower Extremity Assessment Lower Extremity Assessment: Overall WFL for tasks assessed   Cervical / Trunk Assessment Cervical / Trunk Assessment: Back Surgery   Communication Communication Communication: No difficulties   Cognition Arousal/Alertness: Awake/alert Behavior During Therapy: WFL for tasks assessed/performed Overall Cognitive Status: Within Functional Limits for tasks assessed  General Comments       Exercises     Shoulder Instructions      Home Living Family/patient expects to be discharged to:: Private residence Living Arrangements: Children;Spouse/significant other Available Help at Discharge: Family;Available 24 hours/day Type of Home: House Home Access: Stairs to enter [employer] of Steps: 7   Home Layout: One level     Bathroom Shower/Tub: Producer, television/film/video: Standard     Home Equipment: Crutches          Prior Functioning/Environment Prior Level of Function : Independent/Modified Independent;Driving;Working/employed (until last month)             Mobility Comments: walking  with one crutch          OT Problem List:        OT Treatment/Interventions:      OT Goals(Current goals can be found in the care plan section)    OT Frequency:      Co-evaluation              AM-PAC OT "6 Clicks" Daily Activity     Outcome Measure Help from another person eating meals?: None Help from another person taking care of personal grooming?: None Help from another person toileting, which includes using toliet, bedpan, or urinal?: None Help from another person bathing (including washing, rinsing, drying)?: None Help from another person to put on and taking off regular upper body clothing?: None Help from another person to put on and taking off regular lower body clothing?: None 6 Click Score: 24   End of Session    Activity Tolerance: Patient tolerated treatment well Patient left: in bed;with call bell/phone within reach;with family/visitor present  OT Visit Diagnosis: Pain                Time: 8295-6213 OT Time Calculation (min): 29 min Charges:  OT General Charges $OT Visit: 1 Visit OT Evaluation $OT Eval Low Complexity: 1 Low OT Treatments $Self Care/Home Management : 8-22 mins  Berna Spare, OTR/L Acute Rehabilitation Services Office: [phone]   Evern Bio 06/09/2022, 4:10 PM

## 2022-06-09 NOTE — Transfer of Care (Signed)
Immediate Anesthesia Transfer of Care Note  Patient: Douglas Kirk  Procedure(s) Performed: Microdiscectomy Lumbar Five-Sacral One Right (Right: Back)  Patient Location: PACU  Anesthesia Type:General  Level of Consciousness: awake, alert , oriented, and patient cooperative  Airway & Oxygen Therapy: Patient Spontanous Breathing  Post-op Assessment: Report given to RN and Post -op Vital signs reviewed and stable  Post vital signs: Reviewed and stable  Last Vitals:  Vitals Value Taken Time  BP 132/83 06/09/22 1010  Temp    Pulse 90 06/09/22 1013  Resp 17 06/09/22 1013  SpO2 98 % 06/09/22 1013  Vitals shown include unvalidated device data.  Last Pain:  Vitals:   06/09/22 0559  TempSrc:   PainSc: 4       Patients Stated Pain Goal: 0 (06/09/22 0559)  Complications: No notable events documented.

## 2022-06-09 NOTE — Op Note (Signed)
NAMEDONOVEN, PETT MEDICAL RECORD NO: [id number] ACCOUNT NO: [id number] DATE OF BIRTH: [birth year] FACILITY: [facility] LOCATION: [facility] PHYSICIAN: Javier Docker, MD  Operative Report   DATE OF PROCEDURE: 06/09/2022  PREOPERATIVE DIAGNOSIS:  Spinal stenosis, HNP, L5-S1, right.  POSTOPERATIVE DIAGNOSES:  Spinal stenosis, HNP, L5-S1, right, extensive epidural venous plexus.  PROCEDURE PERFORMED:   1.  Microdiskectomy L5-S1, right. 2.  Foraminotomies L5, right. 3.  Lysis of epidural venous plexus, extensive.  ANESTHESIA:  General.  ASSISTANT:  Andrez Grime, PA.  HISTORY:  32 year old male with a foraminal disk herniation migrating cephalad compressing the 5 root from the L5-S1 disk space.  The patient with underlying scoliosis and disk degeneration at L5-S1 who was indicated for diskectomy and decompression of  the lateral recess in the L5 nerve root.  Risks and benefits discussed including bleeding, infection, damage to neurovascular structures, no change in symptoms, worsening symptoms, DVT, PE, anesthetic complications, etc.  Preoperatively the patient had a foot drop on the right.  Significant EHL weakness and neural tension signs, diminished sensation L5 nerve root dermatome.  TECHNIQUE:  The patient in supine position, after induction of adequate general anesthesia, 2 grams Kefzol was placed prone on the Wilson frame.  All bony prominences were well padded.  Lumbar region was prepped and draped in the usual sterile fashion.   Two 18-gauge spinal needles were utilized to localize 5-1 interspace, confirmed with x-ray.  Incision was made from the spinous process of L5 to S1.  Subcutaneous tissue was dissected.  Electrocautery was utilized to achieve hemostasis.  Dorsal lumbar  fascia divided in line with skin incision.  Paraspinous muscle elevated from lamina of L5 and S1.  Operating microscope was draped and brought in the surgical field.  This disk herniation migrated cephalad up into  the foramen of 5 underneath the 5 root.   This required a laminotomy to identify the L5 foramen.  High-speed bur was utilized to perform a hemilaminotomy of the caudad edge of 5.  This is the point at least to the point to expose the superior articulating process of S1 and the foramen of 5,  preserving ample pars.  I used a 2 mm Kerrison to extend the hemilaminotomy.  The tip of the superior articulating process was identified. I used a straight curette to then began removing the medial and superior aspect of the superior articulating  process.  This was approximately 5 mm of width.  I also used a 2 mm Kerrison to then resect the medial portion of the superior articulating process to the pedicle.  The disk herniation was noted to be into the foramen.  Following this, I used a Woodson  to develop a plane between ligamentum flavum and the underlying neural elements.  Ligamentum flavum was then removed exposing the foramen of 5.  There was extensive epidural venous plexus tethered over this region.  On the medial aspect of the pedicle  and the superior aspect of the pedicle we identified this corner and then the disk space at L5-S1 began clearing the disk space from the epidural venous plexus.  These were identified, gently lifted and cauterized and lysed.  I then further identified  the disk space.  Additional multiple veins were cauterized and lysed. This further identified the disk space.  After a confirmatory radiograph I performed an annulotomy and disk material was removed from the disk space with a micro upbiting pituitary  angling inwards from the mid portion of the pedicle just to the medial to  the medial border of the pedicle.  I then identified the full disk space, osteophytic spurring due to the degeneration.  I used a Woodson and a vein retractor to protect the 5 root  cephalad.  I then used a combination of a Woodson and a nerve hook to probe beneath the 5 root, identifying 3 separate fragments of  disk herniation that were retrieved from position consistent with that seen on the MRI.  This fully freed up the foramen  of 5 and a Woodson probe passed freely above the remainder of the foramen of 5 up in pedicle of 5 and then S1.  Copiously irrigated the disk space, used an Epstein to further mobilize disk material.  Inspection revealed no evidence of CSF leakage or  active bleeding after copious irrigation.  I then after obtaining a confirmatory radiograph irrigated copiously, once again.  We felt the pathology had been addressed.  Placed thrombin-soaked Gelfoam in the laminotomy defect and then removed the  thrombin-soaked Gelfoam.  I removed the McCulloch retractor, irrigated the paraspinous musculature, achieved strict hemostasis with bipolar cautery, closed the dorsal lumbar fascia with #1 Vicryl in a figure-of-eight sutures, subcutaneous with 2-0 and  skin with subcuticular Prolene.  Sterile dressing applied.  Placed supine on the hospital bed, extubated without difficulty and transported to the recovery room in satisfactory condition.  The patient tolerated the procedure well.  No complications.  ASSISTANT:  Andrez Grime, PA.  BLOOD LOSS:  25 mL.   PUS D: 06/09/2022 10:04:18 am T: 06/09/2022 2:10:00 pm  JOB: [id number]/ [id number]

## 2022-06-09 NOTE — Interval H&P Note (Signed)
History and Physical Interval Note:  06/09/2022 7:18 AM  Douglas Kirk  has presented today for surgery, with the diagnosis of Herniated disc L5-S1 right.  The various methods of treatment have been discussed with the patient and family. After consideration of risks, benefits and other options for treatment, the patient has consented to  Procedure(s): Microdiscectomy L5-S1 Right (Right) as a surgical intervention.  The patient's history has been reviewed, patient examined, no change in status, stable for surgery.  I have reviewed the patient's chart and labs.  Questions were answered to the patient's satisfaction.     Javier Docker

## 2022-06-09 NOTE — Discharge Instructions (Signed)

## 2022-06-09 NOTE — Anesthesia Postprocedure Evaluation (Signed)
Anesthesia Post Note  Patient: Douglas Kirk  Procedure(s) Performed: Microdiscectomy Lumbar Five-Sacral One Right (Right: Back)     Patient location during evaluation: PACU Anesthesia Type: General Level of consciousness: sedated Pain management: pain level controlled Vital Signs Assessment: post-procedure vital signs reviewed and stable Respiratory status: spontaneous breathing and respiratory function stable Cardiovascular status: stable Postop Assessment: no apparent nausea or vomiting Anesthetic complications: no   No notable events documented.  Last Vitals:  Vitals:   06/09/22 1050 06/09/22 1100  BP:  134/77  Pulse: 76 72  Resp: 12 14  Temp:    SpO2: 97% 96%    Last Pain:  Vitals:   06/09/22 1100  TempSrc:   PainSc: 4                  [name]

## 2022-06-10 ENCOUNTER — Encounter (HOSPITAL_COMMUNITY): Payer: Self-pay | Admitting: Specialist
